# Patient Record
Sex: Male | Born: 1985 | Race: Black or African American | Hispanic: No | Marital: Single | Smoking: Never smoker
Health system: Southern US, Community
[De-identification: ages and names within clinical notes are randomized; demographics above are authoritative.]

---

## 2021-03-11 ENCOUNTER — Ambulatory Visit (HOSPITAL_COMMUNITY): Payer: Self-pay

## 2021-03-11 ENCOUNTER — Other Ambulatory Visit: Payer: Self-pay

## 2021-03-11 ENCOUNTER — Ambulatory Visit (HOSPITAL_COMMUNITY)
Admission: EM | Admit: 2021-03-11 | Discharge: 2021-03-11 | Disposition: A | Discharge status: Home / Self Care | Payer: Self-pay | Attending: Emergency Medicine | Admitting: Emergency Medicine

## 2021-03-11 ENCOUNTER — Ambulatory Visit (INDEPENDENT_AMBULATORY_CARE_PROVIDER_SITE_OTHER): Payer: Self-pay

## 2021-03-11 ENCOUNTER — Encounter (HOSPITAL_COMMUNITY): Payer: Self-pay

## 2021-03-11 DIAGNOSIS — S9031XA Contusion of right foot, initial encounter: Secondary | ICD-10-CM

## 2021-03-11 DIAGNOSIS — M25571 Pain in right ankle and joints of right foot: Secondary | ICD-10-CM

## 2021-03-11 MED ORDER — IBUPROFEN 600 MG PO TABS: 600.0000 mg | ORAL_TABLET | Freq: Four times a day (QID) | ORAL | 0 refills | Status: AC | PRN

## 2021-03-11 NOTE — ED Provider Notes (Signed)
HPI  SUBJECTIVE:  Casey Vance is a 35 y.o. male who presents with lateral right foot pain, swelling after dropping a heavy bottle of soap onto it about 3 hours prior to arrival.  States that he sprained his ankle last night, but it is not particularly bothering him right now.  States that he is unable to bear any weight on his foot.  He reports limitation of motion of his foot and toes secondary to the pain.  He tried ice without improvement in his symptoms.  No alleviating factors.  Symptoms are worse with trying to walk and with palpation.  He has no past medical history.  PMD: None.   History reviewed. No pertinent past medical history.  History reviewed. No pertinent surgical history.  History reviewed. No pertinent family history.  Social History   Tobacco Use   Smoking status: Never    Passive exposure: Never   Smokeless tobacco: Never  Vaping Use   Vaping Use: Never used  Substance Use Topics   Alcohol use: Yes   Drug use: Yes    Types: Marijuana    No current facility-administered medications for this encounter.  Current Outpatient Medications:    ibuprofen (ADVIL) 600 MG tablet, Take 1 tablet (600 mg total) by mouth every 6 (six) hours as needed., Disp: 30 tablet, Rfl: 0  No Known Allergies   ROS  As noted in HPI.   Physical Exam  BP 127/85 (BP Location: Right Arm)   Pulse 81   Temp 98.6 F (37 C) (Oral)   Resp 19   SpO2 97%   Constitutional: Well developed, well nourished, no acute distress Eyes:  EOMI, conjunctiva normal bilaterally HENT: Normocephalic, atraumatic,mucus membranes moist Respiratory: Normal inspiratory effort Cardiovascular: Normal rate GI: nondistended skin: No rash, skin intact Musculoskeletal: Tender soft tissue swelling lateral midfoot.  Mild diffuse soft tissue swelling over the entire foot.  Pain with plantar flexion/dorsiflexion, inversion.  No pain with eversion.  Ankle normal, nontender.  Rest of the foot nontender.  Cap  refill less than 2 seconds all toes. Neurologic: Alert & oriented x 3, no focal neuro deficits Psychiatric: Speech and behavior appropriate   ED Course   Medications - No data to display  Orders Placed This Encounter  Procedures   DG Ankle Complete Right    Standing Status:   Standing    Number of Occurrences:   1    Order Specific Question:   Reason for Exam (SYMPTOM  OR DIAGNOSIS REQUIRED)    Answer:   Ankle and foot injury   DG Foot Complete Right    Standing Status:   Standing    Number of Occurrences:   1    Order Specific Question:   Reason for Exam (SYMPTOM  OR DIAGNOSIS REQUIRED)    Answer:   Ankle and foot injury   Crutches    Standing Status:   Standing    Number of Occurrences:   1   Apply ace wrap    Standing Status:   Standing    Number of Occurrences:   1    No results found for this or any previous visit (from the past 24 hour(s)). DG Ankle Complete Right  Result Date: 03/11/2021 CLINICAL DATA:  Ankle and foot injury in a 35 year old male. EXAM: RIGHT ANKLE - COMPLETE 3+ VIEW; RIGHT FOOT COMPLETE - 3+ VIEW COMPARISON:  None FINDINGS: RIGHT ankle: Some soft tissue swelling about the ankle greatest along the lateral ankle inferior to the lateral  malleolus. Ankle mortise is unremarkable. No sign of acute fracture or dislocation. RIGHT foot: Mild soft tissue swelling about the hindfoot/ankle. No sign of fracture or dislocation. IMPRESSION: Mild soft tissue swelling without acute fracture or dislocation of the right ankle or foot. Electronically Signed   By: Donzetta Kohut M.D.   On: 03/11/2021 18:44   DG Foot Complete Right  Result Date: 03/11/2021 CLINICAL DATA:  Ankle and foot injury in a 35 year old male. EXAM: RIGHT ANKLE - COMPLETE 3+ VIEW; RIGHT FOOT COMPLETE - 3+ VIEW COMPARISON:  None FINDINGS: RIGHT ankle: Some soft tissue swelling about the ankle greatest along the lateral ankle inferior to the lateral malleolus. Ankle mortise is unremarkable. No sign of  acute fracture or dislocation. RIGHT foot: Mild soft tissue swelling about the hindfoot/ankle. No sign of fracture or dislocation. IMPRESSION: Mild soft tissue swelling without acute fracture or dislocation of the right ankle or foot. Electronically Signed   By: Donzetta Kohut M.D.   On: 03/11/2021 18:44    ED Clinical Impression  1. Contusion of right foot, initial encounter      ED Assessment/Plan  Reviewed imaging independently.  Mild soft tissue swelling about the hindfoot and ankle, lateral malleolus.  No fracture or dislocation of ankle or foot.  See radiology report for full details.  Patient with a contusion of his right foot.  Home with Ace wrap, crutches, ibuprofen/Tylenol.  Ice, elevate.  Will provide primary care list for routine care.  We will also order assistance in finding a PMD.  Discussed imaging, MDM, treatment plan, and plan for follow-up with patient. patient agrees with plan.   Meds ordered this encounter  Medications   ibuprofen (ADVIL) 600 MG tablet    Sig: Take 1 tablet (600 mg total) by mouth every 6 (six) hours as needed.    Dispense:  30 tablet    Refill:  0      *This clinic note was created using Scientist, clinical (histocompatibility and immunogenetics). Therefore, there may be occasional mistakes despite careful proofreading.  ?    Domenick Gong, MD 03/11/21 1907

## 2021-03-11 NOTE — Discharge Instructions (Addendum)
I rechecked, and did not see anything on your x-ray.  Radiology does not see a fracture on your x-ray either.   This appears to be a soft tissue injury.  Ace wrap, ice, elevation above your heart is much as possible.  Crutches as needed.  600 mg of ibuprofen combined with 1000 mg of Tylenol together 3-4 times a day as needed for pain.   Below is a list of primary care practices who are taking new patients for you to follow-up with.  [facility] internal medicine clinic Ground Floor - [facility], [address] [place], [place], [place] [address] [phone]  [facility] at [facility] [address] [place], [place] [address] [phone]  [facility] [address] [place], [place] [address] [phone]  [facility] [phone] [address] [place] [address]  [facility]: [address] [place] [address]  [phone]  [facility]: [address] [place] [address]  [phone]  [facility] : [address] [place] [address] [phone]  [facility]: [address] [place] [address] [phone]  [facility]: [address] [place] [address] [phone]  Dr. Oneal Grout [address] [facility] [place] [address]  [phone]  Go to www.goodrx.com  or www.costplusdrugs.com to look up your medications. This will give you a list of where you can find your prescriptions at the most affordable prices. Or ask the pharmacist what the cash price is, or if they have any other discount programs available to help make your medication more affordable. This can be less expensive than what you would pay with insurance.

## 2021-03-11 NOTE — ED Triage Notes (Signed)
Pt presents with ankle and foot injury. States he twisted his ankle and dropped a soap on the same right foot. States it has swelled.

## 2022-08-18 IMAGING — DX DG FOOT COMPLETE 3+V*R*
3 series · 3 of 3 positions shown · non-contrast
Comparison: None

CLINICAL DATA: Ankle and foot injury in a 35-year-old male.

EXAM:
RIGHT ANKLE - COMPLETE 3+ VIEW; RIGHT FOOT COMPLETE - 3+ VIEW

[foot ap]
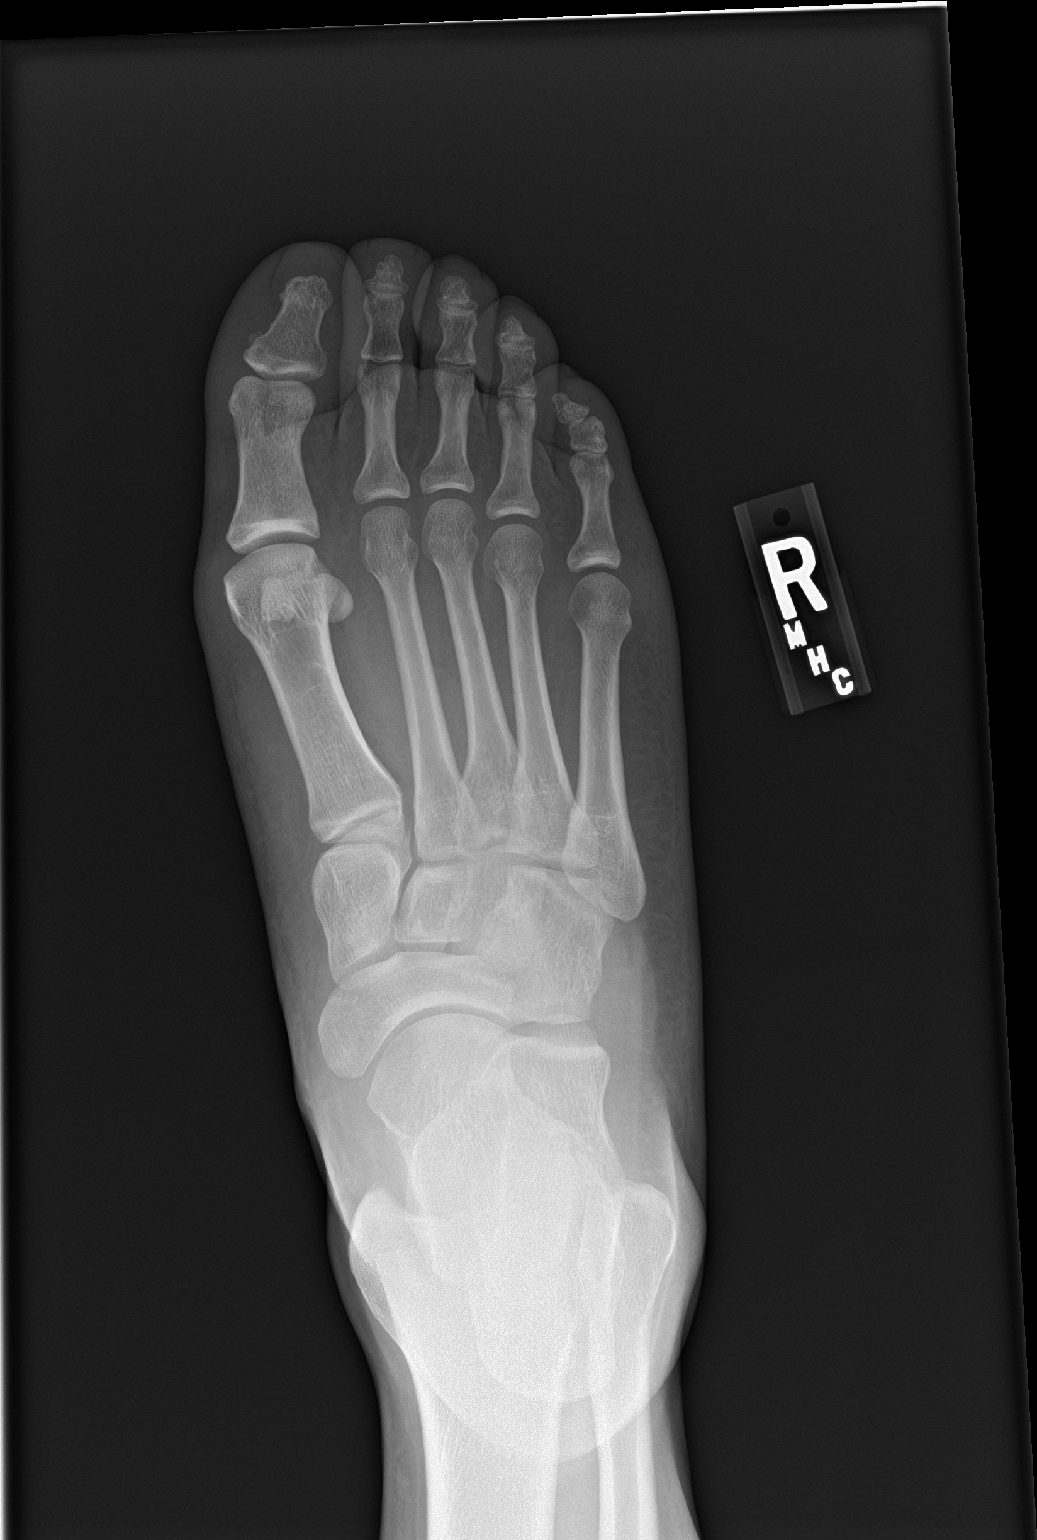

[foot obl]
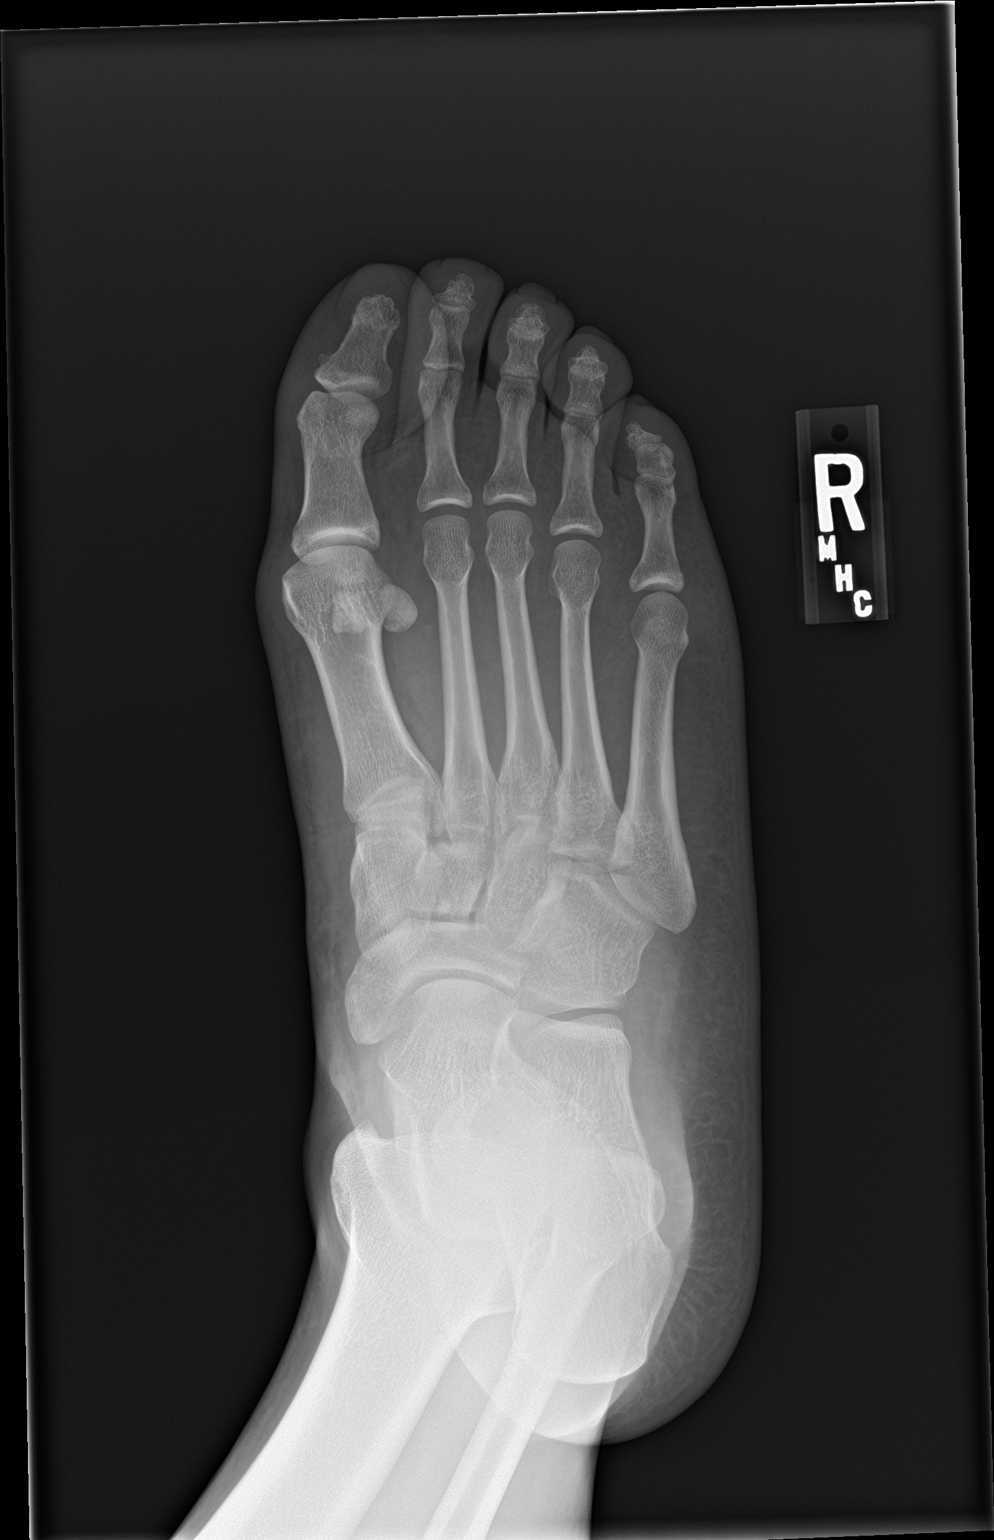

[foot lat]
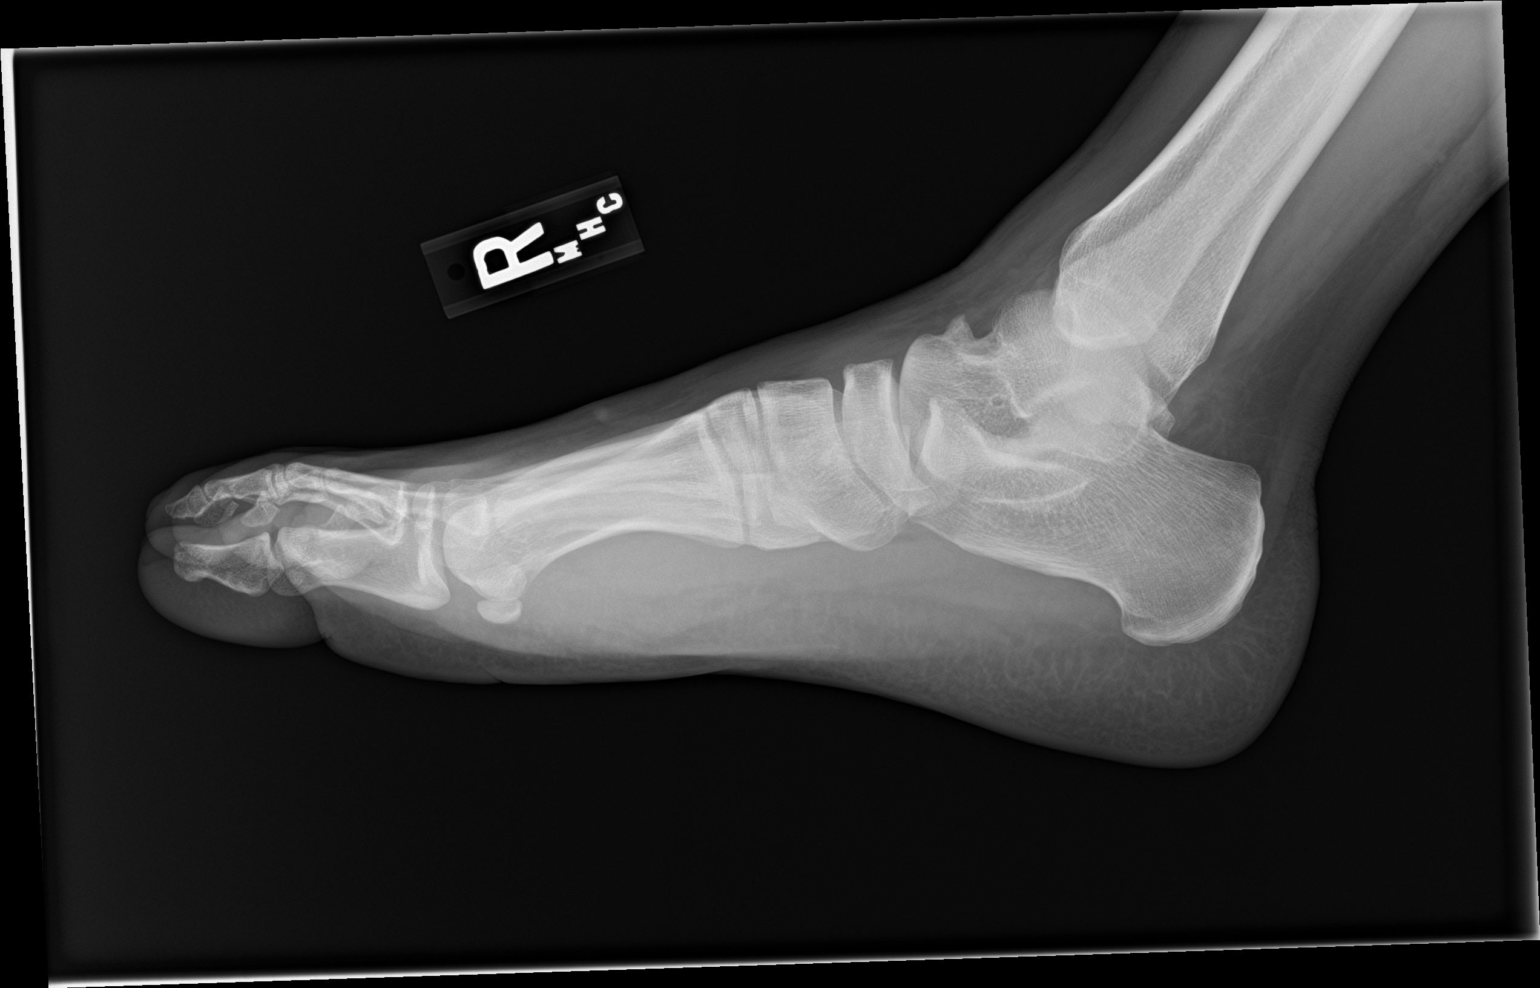

[3 of 3 positions shown; findings below may reference images not displayed]

FINDINGS: RIGHT ankle: Some soft tissue swelling about the ankle greatest
along the lateral ankle inferior to the lateral malleolus.

Ankle mortise is unremarkable. No sign of acute fracture or
dislocation.

RIGHT foot: Mild soft tissue swelling about the hindfoot/ankle.

No sign of fracture or dislocation.
IMPRESSION: Mild soft tissue swelling without acute fracture or dislocation of
the right ankle or foot.

## 2023-12-27 ENCOUNTER — Emergency Department (HOSPITAL_COMMUNITY)
Admission: EM | Admit: 2023-12-27 | Discharge: 2023-12-27 | Discharge status: Against Medical Advice | Payer: Self-pay | Attending: Emergency Medicine | Admitting: Emergency Medicine

## 2023-12-27 DIAGNOSIS — W19XXXA Unspecified fall, initial encounter: Secondary | ICD-10-CM | POA: Insufficient documentation

## 2023-12-27 DIAGNOSIS — Z5321 Procedure and treatment not carried out due to patient leaving prior to being seen by health care provider: Secondary | ICD-10-CM | POA: Insufficient documentation

## 2023-12-27 DIAGNOSIS — S0990XA Unspecified injury of head, initial encounter: Secondary | ICD-10-CM | POA: Insufficient documentation

## 2023-12-27 NOTE — ED Notes (Signed)
 Pt stated they were leaving due to wait, LWBS
